# Patient Record
Sex: Female | Born: 2000
Health system: Southern US, Community
[De-identification: ages and names within clinical notes are randomized; demographics above are authoritative.]

---
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
## 2021-01-07 ENCOUNTER — Other Ambulatory Visit (HOSPITAL_COMMUNITY): Payer: Self-pay | Admitting: Psychiatry

## 2021-01-10 ENCOUNTER — Encounter (HOSPITAL_COMMUNITY): Payer: Self-pay | Admitting: Psychiatry

## 2021-01-10 ENCOUNTER — Ambulatory Visit (INDEPENDENT_AMBULATORY_CARE_PROVIDER_SITE_OTHER): Payer: BC Managed Care – PPO | Admitting: Psychiatry

## 2021-01-10 VITALS — BP 112/70 | HR 84 | Temp 98.3°F | Ht 65.25 in | Wt 153.0 lb

## 2021-01-10 DIAGNOSIS — F411 Generalized anxiety disorder: Secondary | ICD-10-CM

## 2021-01-10 DIAGNOSIS — F4323 Adjustment disorder with mixed anxiety and depressed mood: Secondary | ICD-10-CM

## 2021-01-10 DIAGNOSIS — F0630 Mood disorder due to known physiological condition, unspecified: Secondary | ICD-10-CM | POA: Diagnosis not present
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
## 2021-01-22 ENCOUNTER — Other Ambulatory Visit: Payer: Self-pay | Admitting: Pediatrics

## 2021-02-12 IMAGING — DX DG ANKLE COMPLETE 3+V*L*
3 series · 3 of 3 positions shown · non-contrast
Comparison: None

CLINICAL DATA: Acute LEFT ankle pain, injured jumping down while
cheering yesterday, pain and swelling LEFT foot/ankle

EXAM:
LEFT ANKLE COMPLETE - 3+ VIEW

[ankle lat]
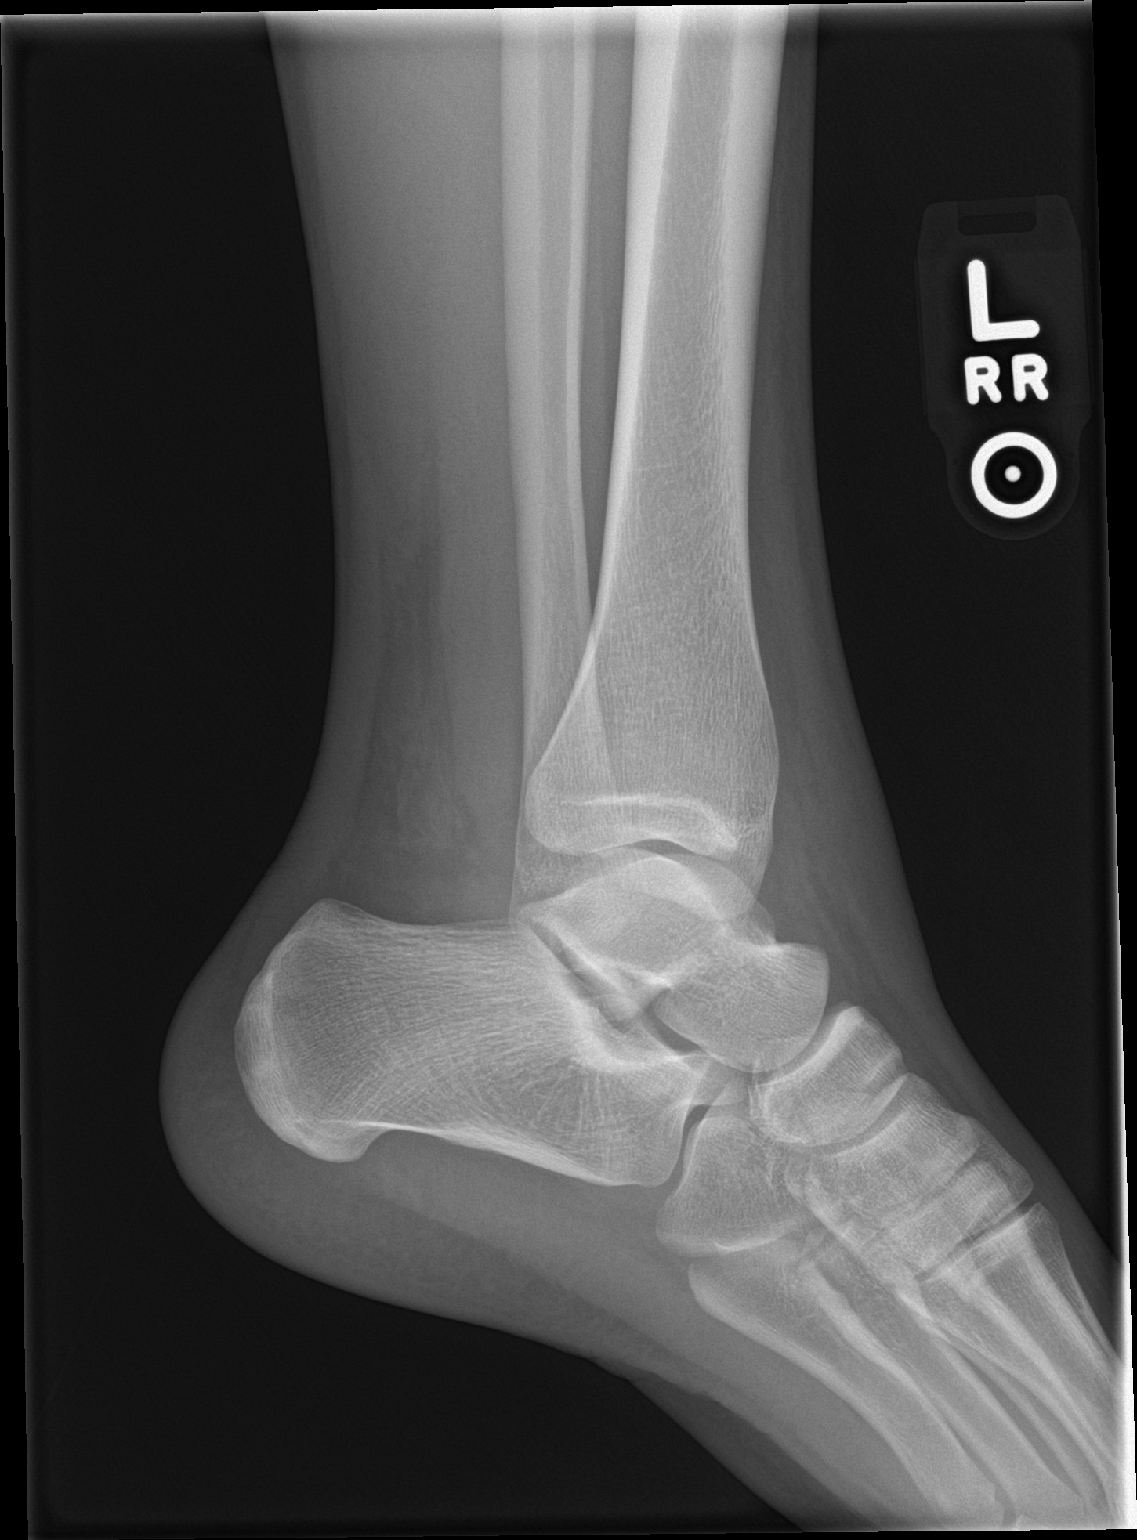

[ankle ap]
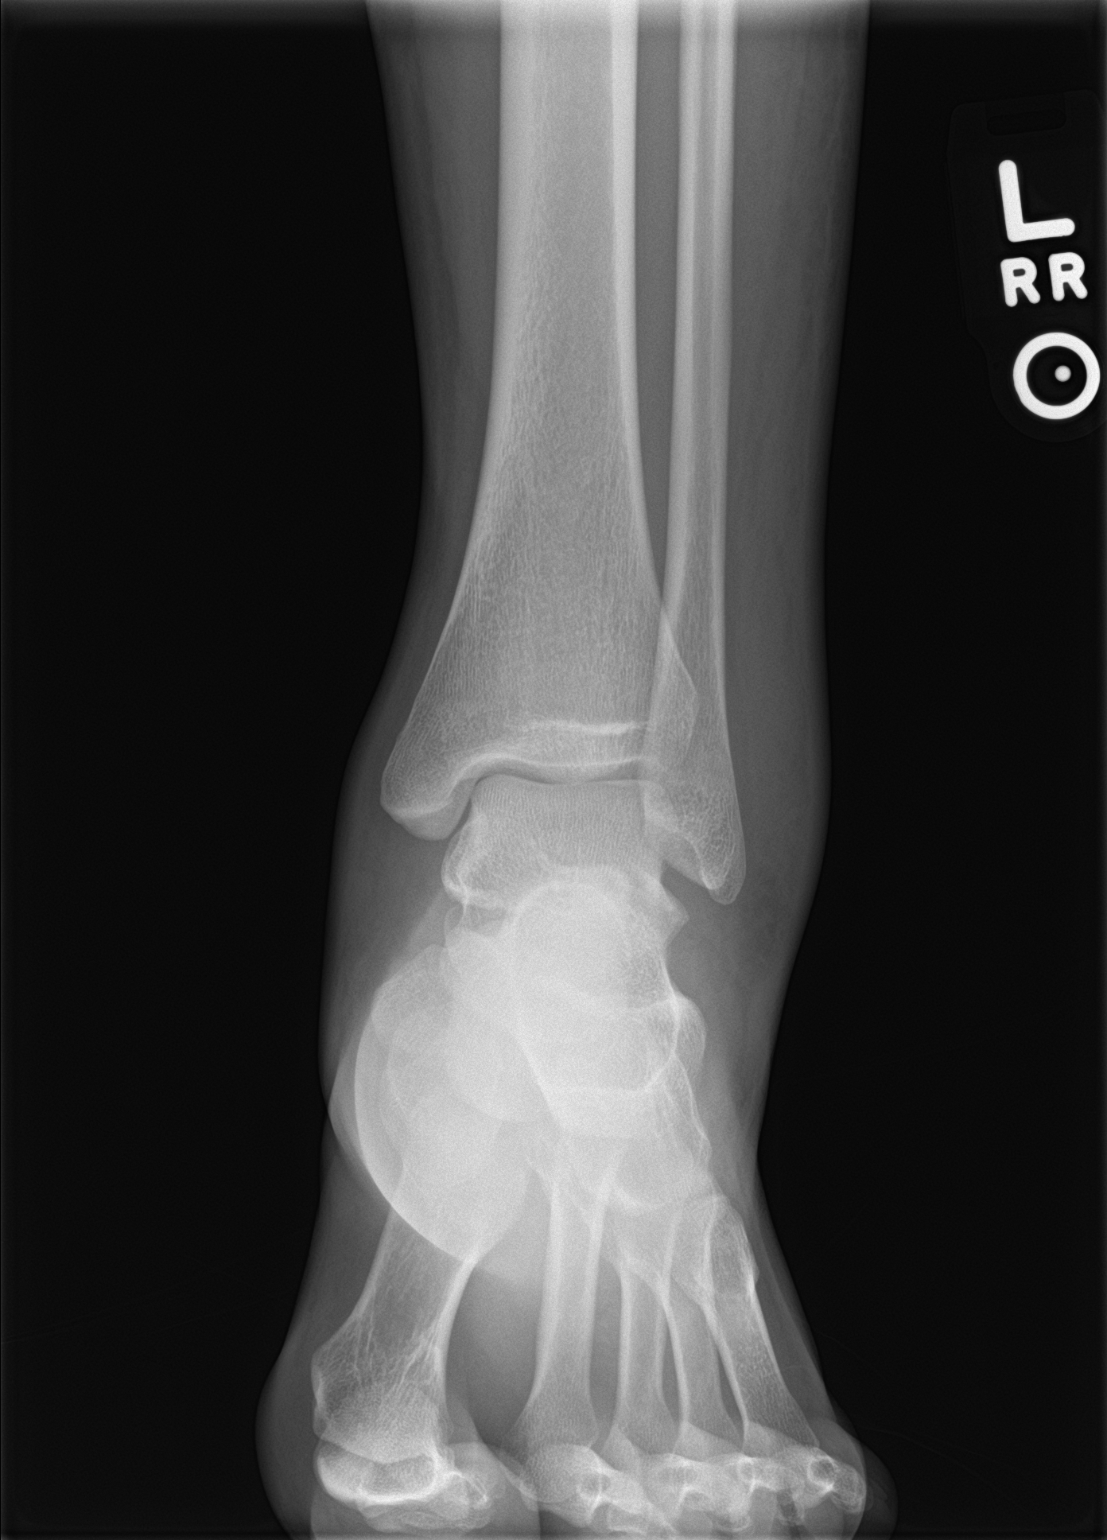

[ankle obl]
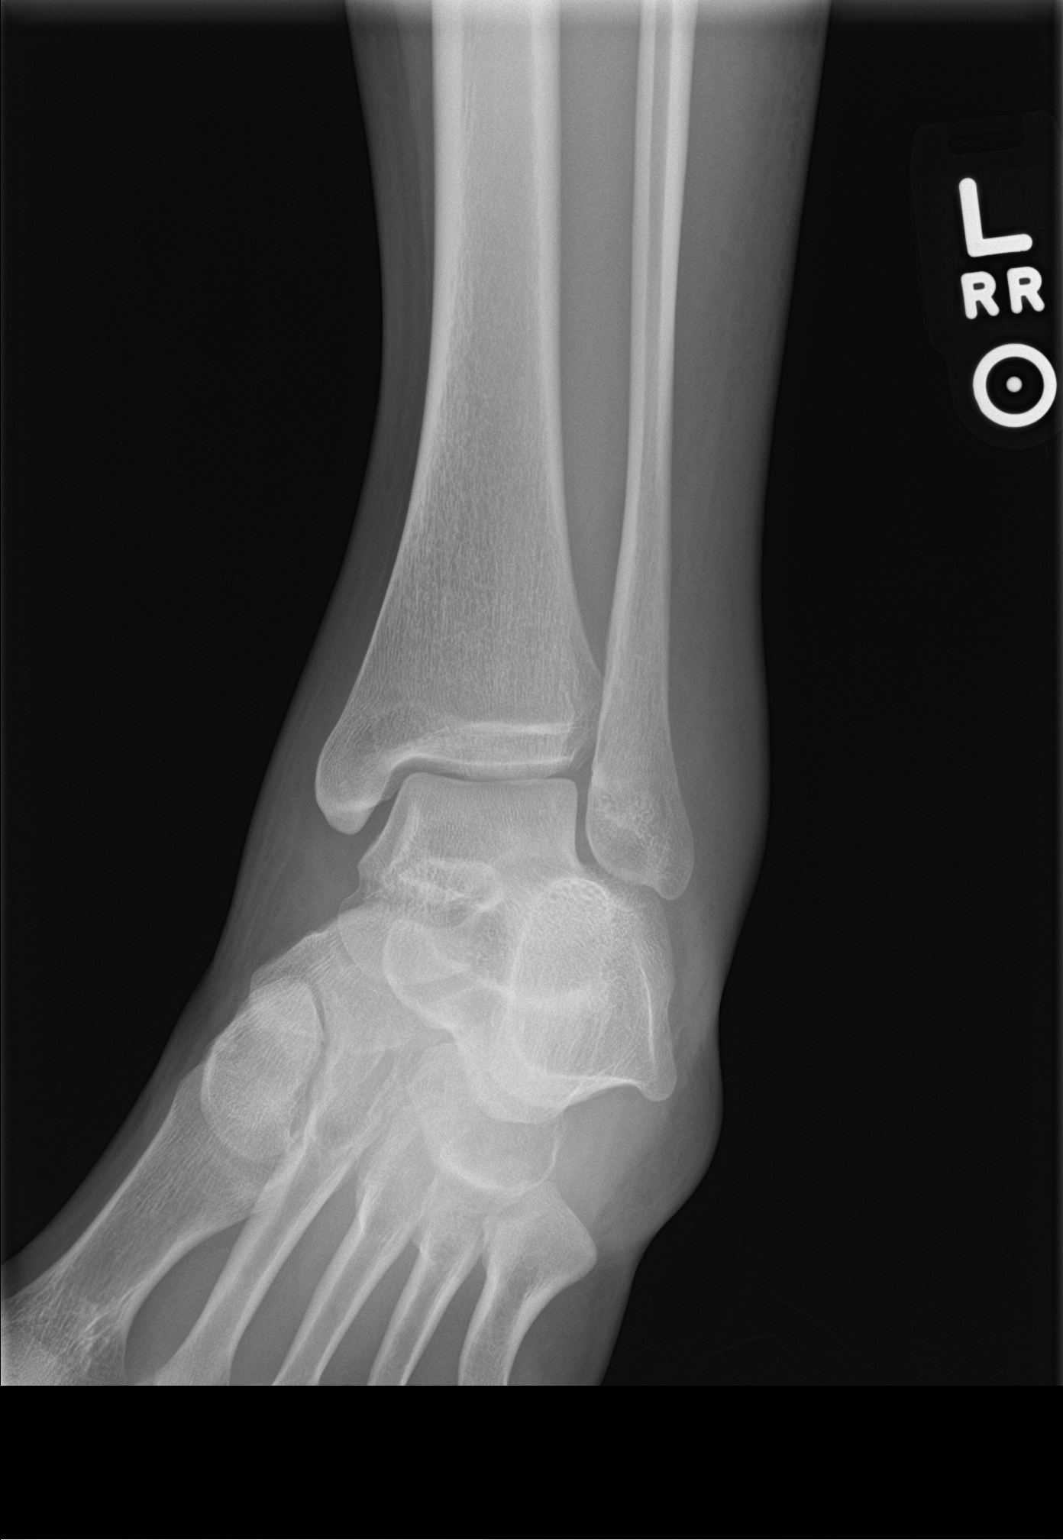

[3 of 3 positions shown; findings below may reference images not displayed]

FINDINGS: Diffuse soft tissue swelling greatest laterally.

Osseous mineralization normal.

Ankle joint space preserved.

No acute fracture, dislocation, or bone destruction.
IMPRESSION: No acute osseous abnormalities.

## 2021-02-12 IMAGING — DX DG FOOT COMPLETE 3+V*L*
3 series · 3 of 3 positions shown · non-contrast
Comparison: None

CLINICAL DATA: Acute LEFT ankle pain, injured jumping down while
cheering yesterday, pain and swelling LEFT foot/ankle

EXAM:
LEFT FOOT - COMPLETE 3+ VIEW

[foot ap]
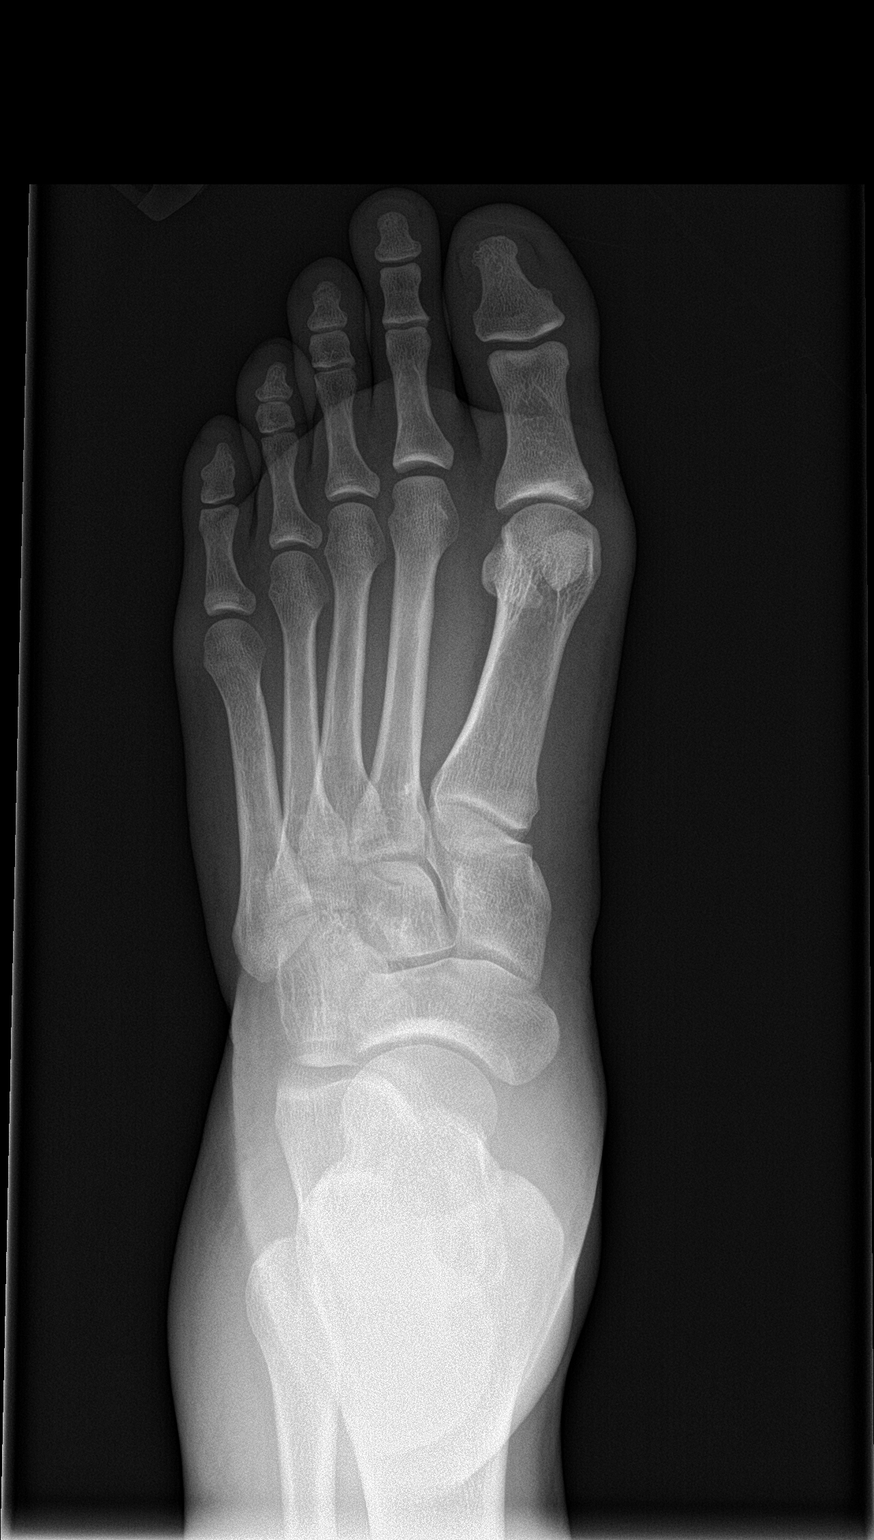

[foot obl]
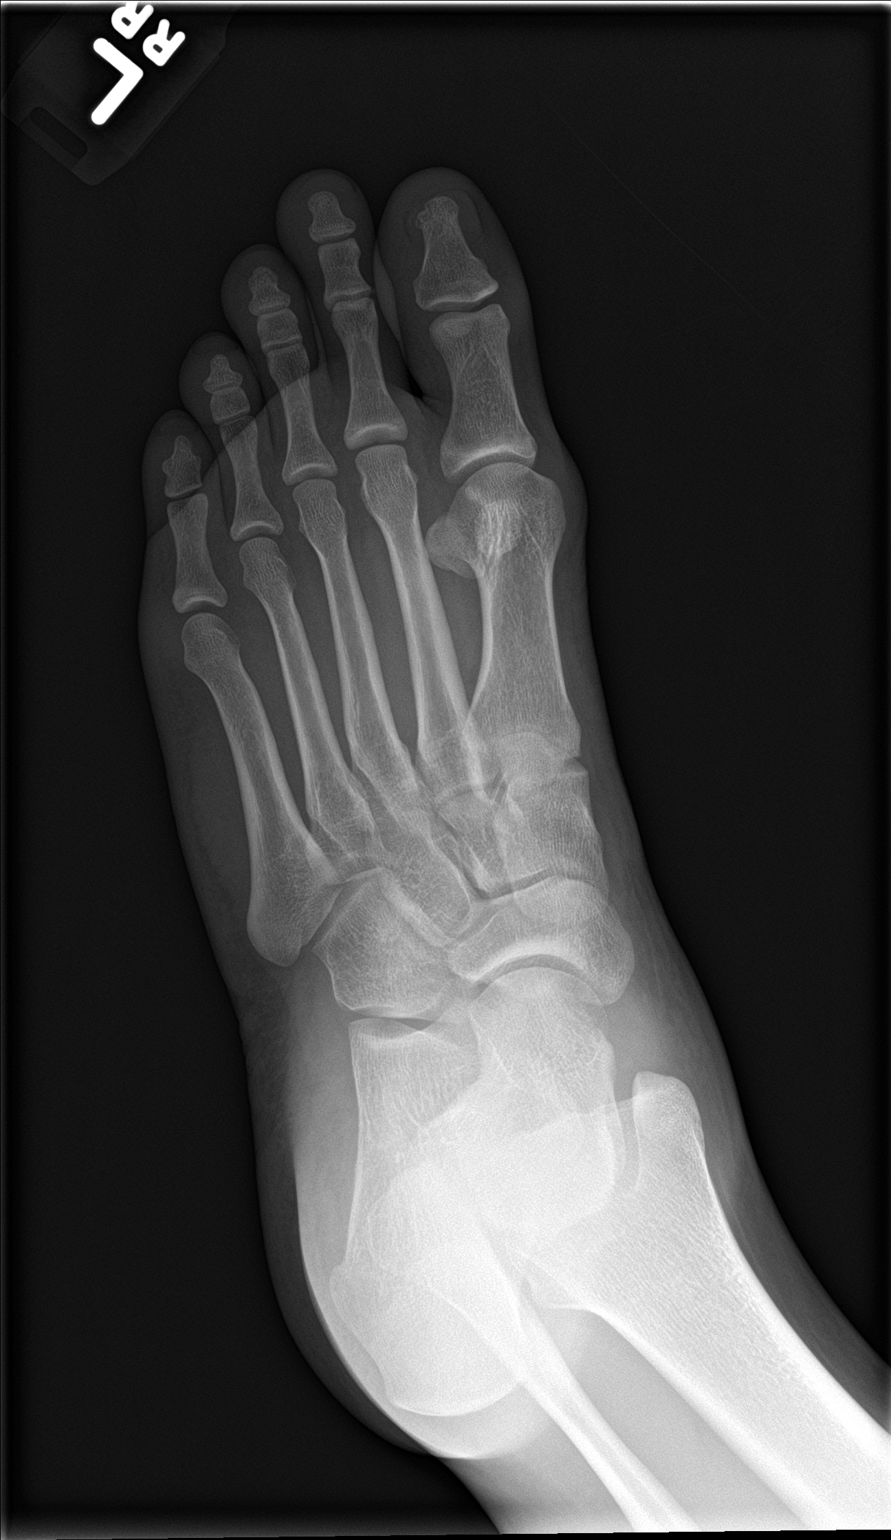

[foot lat]
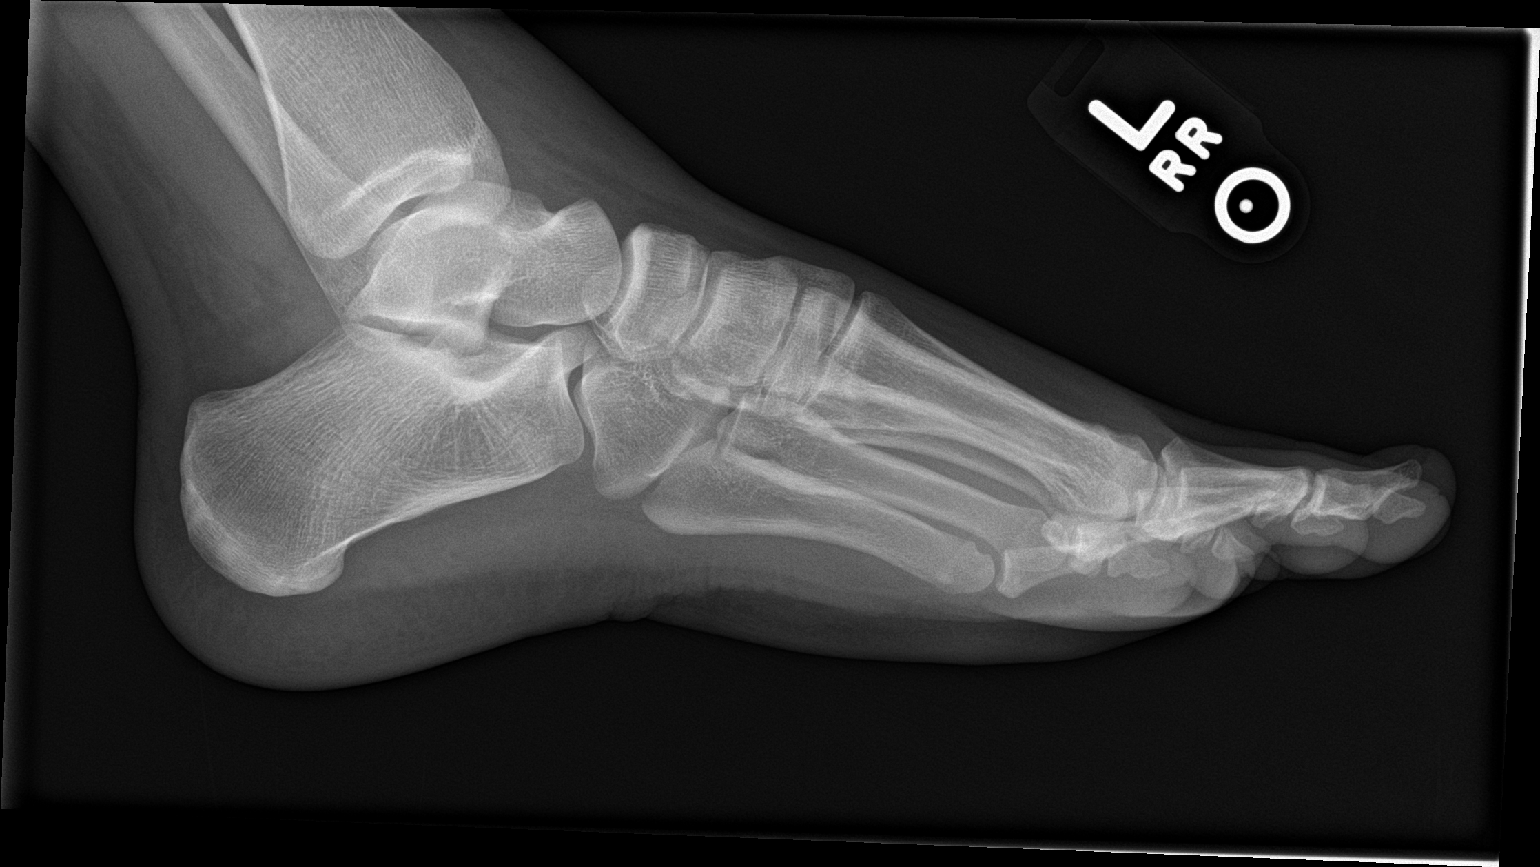

[3 of 3 positions shown; findings below may reference images not displayed]

FINDINGS: Soft tissue swelling anteriorly at ankle extending especially
laterally.

Osseous mineralization normal.

Joint spaces preserved.

No fracture, dislocation, or bone destruction.
IMPRESSION: No acute osseous abnormalities.

## 2021-03-09 ENCOUNTER — Ambulatory Visit (HOSPITAL_COMMUNITY): Payer: BC Managed Care – PPO | Admitting: Licensed Clinical Social Worker

## 2021-03-17 ENCOUNTER — Telehealth (HOSPITAL_COMMUNITY): Payer: BC Managed Care – PPO | Admitting: Psychiatry

## 2021-04-20 ENCOUNTER — Other Ambulatory Visit (HOSPITAL_COMMUNITY): Payer: Self-pay | Admitting: Psychiatry

## 2021-06-03 ENCOUNTER — Other Ambulatory Visit: Payer: Self-pay | Admitting: Pediatrics

## 2021-06-03 ENCOUNTER — Other Ambulatory Visit (HOSPITAL_COMMUNITY): Payer: Self-pay | Admitting: Psychiatry

## 2021-06-03 DIAGNOSIS — F4323 Adjustment disorder with mixed anxiety and depressed mood: Secondary | ICD-10-CM

## 2021-06-06 ENCOUNTER — Telehealth (HOSPITAL_COMMUNITY): Payer: Self-pay

## 2021-06-06 NOTE — Telephone Encounter (Signed)
Patient left vm stating she needs refill on duloxetine. I called her back and left a vm informing her that she missed her last appt and needs to be seen so that we can refill the medication.

## 2021-06-07 ENCOUNTER — Telehealth (INDEPENDENT_AMBULATORY_CARE_PROVIDER_SITE_OTHER): Payer: BC Managed Care – PPO | Admitting: Psychiatry

## 2021-06-07 ENCOUNTER — Encounter (HOSPITAL_COMMUNITY): Payer: Self-pay | Admitting: Psychiatry

## 2021-06-07 DIAGNOSIS — F4323 Adjustment disorder with mixed anxiety and depressed mood: Secondary | ICD-10-CM

## 2021-06-07 DIAGNOSIS — F411 Generalized anxiety disorder: Secondary | ICD-10-CM | POA: Diagnosis not present

## 2021-06-07 DIAGNOSIS — F0630 Mood disorder due to known physiological condition, unspecified: Secondary | ICD-10-CM

## 2021-06-07 MED ORDER — DULOXETINE HCL 30 MG PO CPEP: ORAL_CAPSULE | ORAL | 0 refills | Status: DC

## 2021-06-07 MED ORDER — LAMOTRIGINE 25 MG PO TABS: ORAL_TABLET | ORAL | 0 refills | Status: DC
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
## 2021-06-30 ENCOUNTER — Ambulatory Visit: Payer: Self-pay

## 2021-06-30 ENCOUNTER — Ambulatory Visit: Payer: BC Managed Care – PPO | Admitting: Family Medicine

## 2021-06-30 VITALS — BP 118/64 | Ht 65.0 in | Wt 170.0 lb

## 2021-06-30 DIAGNOSIS — M25572 Pain in left ankle and joints of left foot: Secondary | ICD-10-CM

## 2021-06-30 DIAGNOSIS — M7742 Metatarsalgia, left foot: Secondary | ICD-10-CM | POA: Diagnosis not present

## 2021-06-30 DIAGNOSIS — M357 Hypermobility syndrome: Secondary | ICD-10-CM | POA: Insufficient documentation

## 2021-06-30 MED ORDER — MELOXICAM 7.5 MG PO TABS: 7.5000 mg | ORAL_TABLET | Freq: Two times a day (BID) | ORAL | 1 refills | Status: DC | PRN

## 2021-06-30 NOTE — Patient Instructions (Signed)
Good to see you Please try ice  Please try the exercises  Please use the mobic for 5 days straight and then as needed   Please send me a message in MyChart with any questions or updates.  Please see me back in 4 weeks.   --Dr. Jordan Likes
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
## 2021-06-30 NOTE — Assessment & Plan Note (Signed)
Her hypermobility is likely contributing to her pain with  normal findings on ultrasound.  Has had previous injuries to the left foot. -Counseled on home exercise therapy and supportive care. -Meloxicam. -Cam walker. -Could consider physical therapy or further imaging.

## 2021-06-30 NOTE — Assessment & Plan Note (Signed)
beighton score 9/9.

## 2021-08-12 ENCOUNTER — Other Ambulatory Visit: Payer: Self-pay | Admitting: Family Medicine

## 2021-08-27 ENCOUNTER — Other Ambulatory Visit (HOSPITAL_COMMUNITY): Payer: Self-pay | Admitting: Psychiatry

## 2021-08-27 DIAGNOSIS — F4323 Adjustment disorder with mixed anxiety and depressed mood: Secondary | ICD-10-CM

## 2021-09-06 ENCOUNTER — Encounter (HOSPITAL_COMMUNITY): Payer: Self-pay | Admitting: Psychiatry

## 2021-09-06 ENCOUNTER — Telehealth (INDEPENDENT_AMBULATORY_CARE_PROVIDER_SITE_OTHER): Payer: BC Managed Care – PPO | Admitting: Psychiatry

## 2021-09-06 DIAGNOSIS — F411 Generalized anxiety disorder: Secondary | ICD-10-CM | POA: Diagnosis not present

## 2021-09-06 DIAGNOSIS — F0630 Mood disorder due to known physiological condition, unspecified: Secondary | ICD-10-CM

## 2021-09-06 DIAGNOSIS — F4323 Adjustment disorder with mixed anxiety and depressed mood: Secondary | ICD-10-CM | POA: Diagnosis not present
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
## 2021-11-25 ENCOUNTER — Other Ambulatory Visit (HOSPITAL_COMMUNITY): Payer: Self-pay | Admitting: Psychiatry

## 2021-12-18 ENCOUNTER — Other Ambulatory Visit (HOSPITAL_COMMUNITY): Payer: Self-pay | Admitting: Psychiatry

## 2021-12-18 DIAGNOSIS — F4323 Adjustment disorder with mixed anxiety and depressed mood: Secondary | ICD-10-CM

## 2022-02-22 ENCOUNTER — Other Ambulatory Visit (HOSPITAL_COMMUNITY): Payer: Self-pay | Admitting: Psychiatry

## 2022-03-02 ENCOUNTER — Other Ambulatory Visit (HOSPITAL_COMMUNITY): Payer: Self-pay | Admitting: Psychiatry

## 2022-03-02 DIAGNOSIS — F4323 Adjustment disorder with mixed anxiety and depressed mood: Secondary | ICD-10-CM

## 2022-03-19 ENCOUNTER — Telehealth (INDEPENDENT_AMBULATORY_CARE_PROVIDER_SITE_OTHER): Payer: BC Managed Care – PPO | Admitting: Psychiatry

## 2022-03-19 ENCOUNTER — Encounter (HOSPITAL_COMMUNITY): Payer: Self-pay | Admitting: Psychiatry

## 2022-03-19 DIAGNOSIS — F411 Generalized anxiety disorder: Secondary | ICD-10-CM | POA: Diagnosis not present

## 2022-03-19 DIAGNOSIS — F4323 Adjustment disorder with mixed anxiety and depressed mood: Secondary | ICD-10-CM

## 2022-03-19 DIAGNOSIS — F0630 Mood disorder due to known physiological condition, unspecified: Secondary | ICD-10-CM
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
## 2022-05-24 ENCOUNTER — Other Ambulatory Visit (HOSPITAL_COMMUNITY): Payer: Self-pay | Admitting: Psychiatry

## 2022-05-25 ENCOUNTER — Encounter (HOSPITAL_COMMUNITY): Payer: Self-pay | Admitting: Psychiatry

## 2022-05-25 ENCOUNTER — Telehealth (HOSPITAL_COMMUNITY): Payer: Self-pay | Admitting: Psychiatry
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
## 2022-06-11 ENCOUNTER — Other Ambulatory Visit (HOSPITAL_COMMUNITY): Payer: Self-pay | Admitting: Psychiatry

## 2022-06-11 DIAGNOSIS — F4323 Adjustment disorder with mixed anxiety and depressed mood: Secondary | ICD-10-CM

## 2022-07-23 ENCOUNTER — Telehealth (HOSPITAL_COMMUNITY): Payer: BC Managed Care – PPO | Admitting: Psychiatry

## 2022-08-20 ENCOUNTER — Telehealth (INDEPENDENT_AMBULATORY_CARE_PROVIDER_SITE_OTHER): Payer: BC Managed Care – PPO | Admitting: Psychiatry

## 2022-08-20 ENCOUNTER — Encounter (HOSPITAL_COMMUNITY): Payer: Self-pay | Admitting: Psychiatry

## 2022-08-20 ENCOUNTER — Ambulatory Visit: Payer: Self-pay | Admitting: Podiatry

## 2022-08-20 DIAGNOSIS — F0630 Mood disorder due to known physiological condition, unspecified: Secondary | ICD-10-CM | POA: Diagnosis not present

## 2022-08-20 DIAGNOSIS — F411 Generalized anxiety disorder: Secondary | ICD-10-CM

## 2022-08-20 DIAGNOSIS — F4323 Adjustment disorder with mixed anxiety and depressed mood: Secondary | ICD-10-CM | POA: Diagnosis not present

## 2022-08-20 MED ORDER — DULOXETINE HCL 30 MG PO CPEP: 30.0000 mg | ORAL_CAPSULE | Freq: Every day | ORAL | 0 refills | Status: DC

## 2022-08-20 MED ORDER — LAMOTRIGINE 25 MG PO TABS: 25.0000 mg | ORAL_TABLET | Freq: Every day | ORAL | 0 refills | Status: DC
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
## 2022-08-24 ENCOUNTER — Ambulatory Visit: Payer: Self-pay | Admitting: Podiatry

## 2022-09-10 ENCOUNTER — Telehealth (HOSPITAL_COMMUNITY): Payer: BC Managed Care – PPO | Admitting: Psychiatry

## 2022-09-18 ENCOUNTER — Other Ambulatory Visit (HOSPITAL_COMMUNITY): Payer: Self-pay | Admitting: Psychiatry

## 2022-09-18 DIAGNOSIS — F4323 Adjustment disorder with mixed anxiety and depressed mood: Secondary | ICD-10-CM

## 2022-10-20 ENCOUNTER — Other Ambulatory Visit (HOSPITAL_COMMUNITY): Payer: Self-pay | Admitting: Psychiatry

## 2022-10-22 ENCOUNTER — Encounter: Payer: Self-pay | Admitting: *Deleted

## 2022-11-04 ENCOUNTER — Encounter (HOSPITAL_BASED_OUTPATIENT_CLINIC_OR_DEPARTMENT_OTHER): Payer: Self-pay

## 2022-11-04 ENCOUNTER — Emergency Department (HOSPITAL_BASED_OUTPATIENT_CLINIC_OR_DEPARTMENT_OTHER)
Admission: EM | Admit: 2022-11-04 | Discharge: 2022-11-04 | Disposition: A | Discharge status: Home / Self Care | Payer: BC Managed Care – PPO | Attending: Emergency Medicine | Admitting: Emergency Medicine

## 2022-11-04 DIAGNOSIS — R2242 Localized swelling, mass and lump, left lower limb: Secondary | ICD-10-CM | POA: Diagnosis present

## 2022-11-04 DIAGNOSIS — L03116 Cellulitis of left lower limb: Secondary | ICD-10-CM

## 2022-11-04 DIAGNOSIS — L02416 Cutaneous abscess of left lower limb: Secondary | ICD-10-CM | POA: Diagnosis not present

## 2022-11-04 NOTE — Discharge Instructions (Signed)
Continue doxycycline and topical antibiotic as previously prescribed.  Apply warm compresses as frequently as possible for the next several days.  Return to the ER if not improving in the next 2 to 3 days, or if symptoms worsen or change.

## 2022-11-04 NOTE — ED Triage Notes (Addendum)
Pt reports she has a bump on LLE and it is hurting. Was seen by UC on Friday and was given abx. Provider told her she either has a blood infection or got bit by something. Pt with large raised and warm bump to back of LT knee. Pain to mid upper thigh and mid lower calf. No fevers but feels nauseous. Pt on doxy now. Took tylenol and ibuprofen at 10pm with no relief.

## 2022-11-04 NOTE — ED Provider Notes (Signed)
[place] [facility] Provider Note   CSN: [id number] Arrival date & time: 11/04/22  0020     History  Chief Complaint  Patient presents with   Leg Swelling    Sherry Foley is a 22 y.o. female.  Patient is a 22 year old female presenting with complaints of a painful lump to the back of her left leg.  This has been there for the past 5 days.  She was seen at urgent care 2 days ago and prescribed doxycycline and what sounds like Bactroban, but this does not seem to be helping.  She denies any fevers or chills.  She denies any injury or trauma.  The history is provided by the patient.       Home Medications Prior to Admission medications   Medication Sig Start Date End Date Taking? Authorizing Provider  DULoxetine (CYMBALTA) 30 MG capsule Take 1 capsule (30 mg total) by mouth daily. 08/20/22   Thresa Ross, MD  fluticasone (FLONASE) 50 MCG/ACT nasal spray 1 spray by Each Nare route daily for 30 days. 04/25/20   [provider]  lamoTRIgine (LAMICTAL) 25 MG tablet TAKE 1 TABLET (25 MG TOTAL) BY MOUTH DAILY. 10/22/22   Thresa Ross, MD  levocetirizine Elita Boone) 5 MG tablet Take by mouth. 04/25/20 04/25/21  [provider]  Levonorgestrel-Ethinyl Estradiol (AMETHIA) 0.15-0.03 &0.01 MG tablet TAKE 1 TABLET BY MOUTH EVERY DAY 02/09/19 02/09/20  [provider]  meloxicam (MOBIC) 7.5 MG tablet TAKE 1 TABLET BY MOUTH 2 TIMES DAILY AS NEEDED. 08/14/21   Myra Rude, MD  ondansetron (ZOFRAN-ODT) 4 MG disintegrating tablet Take 4 mg by mouth every 8 (eight) hours as needed. 04/25/20   [provider]  predniSONE (DELTASONE) 20 MG tablet Take by mouth. 04/25/20   [provider]  trimethoprim-polymyxin b (POLYTRIM) ophthalmic solution SMARTSIG:In Eye(s) 06/01/20   [provider]  Vitamin D, Ergocalciferol, (DRISDOL) 1.25 MG (50000 UNIT) CAPS capsule TAKE 1 CAPSULE (50,000 UNITS TOTAL) BY MOUTH EVERY 7  (SEVEN) DAYS. 12/02/19   Georges Mouse, NP      Allergies    Patient has no known allergies.    Review of Systems   Review of Systems  All other systems reviewed and are negative.   Physical Exam Updated Vital Signs BP 133/71 (BP Location: Right Arm)   Pulse 94   Temp 98.2 F (36.8 C) (Oral)   Resp 20   SpO2 99%  Physical Exam Vitals and nursing note reviewed.  Constitutional:      Appearance: Normal appearance.  HENT:     Head: Normocephalic and atraumatic.  Pulmonary:     Effort: Pulmonary effort is normal.  Skin:    General: Skin is warm and dry.     Comments: There is a 1.5 cm x 2.5 cm indurated area to the back of the left leg just below the knee.  There is no palpable fluctuance.  Neurological:     Mental Status: She is alert and oriented to person, place, and time.     ED Results / Procedures / Treatments   Labs (all labs ordered are listed, but only abnormal results are displayed) Labs Reviewed - No data to display  EKG None  Radiology No results found.  Procedures Procedures    Medications Ordered in ED Medications - No data to display  ED Course/ Medical Decision Making/ A&P  Patient presenting with what appears to be an early abscess/cellulitis to the back of the  left leg.  She has been on doxycycline for 2 days but it does not seem to be going away.  Patient given the option of incision and drainage versus aggressive warm soaks.  Patient is declining I&D at this time.  I will have her continue the doxycycline and mupirocin and have her perform warm compresses as frequently as possible for the next few days.  Final Clinical Impression(s) / ED Diagnoses Final diagnoses:  None    Rx / DC Orders ED Discharge Orders     None         Geoffery Lyons, MD 11/04/22 [phone]

## 2022-12-12 ENCOUNTER — Other Ambulatory Visit (HOSPITAL_COMMUNITY): Payer: Self-pay | Admitting: Psychiatry

## 2022-12-12 ENCOUNTER — Telehealth (HOSPITAL_COMMUNITY): Payer: Self-pay | Admitting: *Deleted

## 2022-12-12 DIAGNOSIS — F4323 Adjustment disorder with mixed anxiety and depressed mood: Secondary | ICD-10-CM

## 2022-12-13 NOTE — Telephone Encounter (Signed)
Re-Opened in Error 

## 2022-12-17 ENCOUNTER — Encounter (HOSPITAL_COMMUNITY): Payer: Self-pay | Admitting: Psychiatry

## 2022-12-17 ENCOUNTER — Telehealth (INDEPENDENT_AMBULATORY_CARE_PROVIDER_SITE_OTHER): Payer: BC Managed Care – PPO | Admitting: Psychiatry

## 2022-12-17 DIAGNOSIS — F0630 Mood disorder due to known physiological condition, unspecified: Secondary | ICD-10-CM | POA: Diagnosis not present

## 2022-12-17 DIAGNOSIS — F411 Generalized anxiety disorder: Secondary | ICD-10-CM

## 2022-12-17 DIAGNOSIS — F4323 Adjustment disorder with mixed anxiety and depressed mood: Secondary | ICD-10-CM

## 2022-12-17 MED ORDER — LAMOTRIGINE 25 MG PO TABS: 25.0000 mg | ORAL_TABLET | Freq: Every day | ORAL | 0 refills | Status: DC
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
## 2023-03-26 ENCOUNTER — Telehealth (HOSPITAL_COMMUNITY): Payer: Self-pay | Admitting: *Deleted

## 2023-03-26 MED ORDER — LAMOTRIGINE 25 MG PO TABS: 25.0000 mg | ORAL_TABLET | Freq: Every day | ORAL | 0 refills | Status: DC

## 2023-03-26 NOTE — Addendum Note (Signed)
Addended by: Thresa Ross on: 03/26/2023 09:19 AM   Modules accepted: Orders

## 2023-03-26 NOTE — Telephone Encounter (Signed)
Rx Refill Request --   [id number] - [place],  [place] - [address]   lamoTRIgine (LAMICTAL) 25 MG tablet 90 tablet  Sig - Route: Take 1 tablet (25 mg total) by mouth daily. - Oral   Next Appt --- 06/17/23 Last Appt --  12/17/22

## 2023-04-13 ENCOUNTER — Other Ambulatory Visit (HOSPITAL_COMMUNITY): Payer: Self-pay | Admitting: Psychiatry

## 2023-04-13 DIAGNOSIS — F4323 Adjustment disorder with mixed anxiety and depressed mood: Secondary | ICD-10-CM

## 2023-06-17 ENCOUNTER — Telehealth (HOSPITAL_COMMUNITY): Payer: BC Managed Care – PPO | Admitting: Psychiatry

## 2023-06-17 ENCOUNTER — Encounter (HOSPITAL_COMMUNITY): Payer: Self-pay | Admitting: Psychiatry

## 2023-06-17 DIAGNOSIS — F411 Generalized anxiety disorder: Secondary | ICD-10-CM | POA: Diagnosis not present

## 2023-06-17 DIAGNOSIS — F4323 Adjustment disorder with mixed anxiety and depressed mood: Secondary | ICD-10-CM

## 2023-06-17 DIAGNOSIS — F0630 Mood disorder due to known physiological condition, unspecified: Secondary | ICD-10-CM | POA: Diagnosis not present

## 2023-06-17 MED ORDER — DULOXETINE HCL 30 MG PO CPEP: 30.0000 mg | ORAL_CAPSULE | Freq: Every day | ORAL | 0 refills | Status: DC

## 2023-06-17 MED ORDER — LAMOTRIGINE 25 MG PO TABS: 37.5000 mg | ORAL_TABLET | Freq: Every day | ORAL | 1 refills | Status: DC
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
## 2023-06-27 ENCOUNTER — Other Ambulatory Visit (HOSPITAL_COMMUNITY): Payer: Self-pay | Admitting: Psychiatry

## 2023-08-19 ENCOUNTER — Telehealth (HOSPITAL_COMMUNITY): Payer: Self-pay | Admitting: *Deleted

## 2023-08-19 DIAGNOSIS — F4323 Adjustment disorder with mixed anxiety and depressed mood: Secondary | ICD-10-CM

## 2023-08-19 MED ORDER — DULOXETINE HCL 30 MG PO CPEP: 30.0000 mg | ORAL_CAPSULE | Freq: Every day | ORAL | 0 refills | Status: DC

## 2023-08-19 NOTE — Addendum Note (Signed)
 Addended by: Wray Heady on: 08/19/2023 09:58 AM   Modules accepted: Orders

## 2023-08-19 NOTE — Telephone Encounter (Signed)
 [id number] - [place], [place] - [address]   DULoxetine  (CYMBALTA ) 30 MG capsule   Next Appt  12/16/23 Last Appt  06/17/23

## 2023-08-22 ENCOUNTER — Ambulatory Visit: Payer: Self-pay

## 2023-08-22 ENCOUNTER — Ambulatory Visit
Admission: EM | Admit: 2023-08-22 | Discharge: 2023-08-22 | Disposition: A | Discharge status: Home / Self Care | Payer: BC Managed Care – PPO | Attending: Family Medicine | Admitting: Family Medicine

## 2023-08-22 DIAGNOSIS — L309 Dermatitis, unspecified: Secondary | ICD-10-CM

## 2023-08-22 MED ORDER — CLOTRIMAZOLE-BETAMETHASONE 1-0.05 % EX CREA: TOPICAL_CREAM | CUTANEOUS | 0 refills | Status: AC

## 2023-08-22 NOTE — ED Provider Notes (Signed)
[facility]    CSN: [id number] Arrival date & time: 08/22/23  0910      History   Chief Complaint Chief Complaint  Patient presents with   Rash    HPI Sherry Foley is a 23 y.o. female.    Rash Here for a rash in her left axilla that is been there for about a month.  She has tried some cortisone cream and tried some emollient creams.  Nothing is really helping.  No drainage from the area and no fever or chills.  It has been itchy at times and sometimes it burns.  NKDA   Last menstrual cycle was about 2 weeks ago.    History reviewed. No pertinent past medical history.  Patient Active Problem List   Diagnosis Date Noted   Metatarsalgia of left foot 06/30/2021   Hypermobility syndrome 06/30/2021   Dizziness 05/09/2020   Sprain of anterior talofibular ligament of left ankle 10/22/2019   Strain of right hip adductor muscle 10/12/2019   Persistent adjustment disorder with mixed anxiety and depressed mood 03/25/2019   Generalized anxiety disorder 10/24/2017   Injury of right elbow 12/16/2015   Right ankle pain 10/03/2015    History reviewed. No pertinent surgical history.  OB History   No obstetric history on file.      Home Medications    Prior to Admission medications   Medication Sig Start Date End Date Taking? Authorizing Provider  clotrimazole-betamethasone (LOTRISONE) cream Apply to affected area 2 times daily till better, up to 2 weeks. 08/22/23  Yes Zenia Resides, MD  DULoxetine (CYMBALTA) 30 MG capsule Take 1 capsule (30 mg total) by mouth daily. 08/19/23   Thresa Ross, MD  fluticasone (FLONASE) 50 MCG/ACT nasal spray 1 spray by Each Nare route daily for 30 days. 04/25/20   [provider]  lamoTRIgine (LAMICTAL) 25 MG tablet TAKE 1 TABLET (25 MG TOTAL) BY MOUTH DAILY. 06/27/23   Thresa Ross, MD  levocetirizine Elita Boone) 5 MG tablet Take by mouth. 04/25/20 04/25/21  [provider]  Levonorgestrel-Ethinyl  Estradiol (AMETHIA) 0.15-0.03 &0.01 MG tablet TAKE 1 TABLET BY MOUTH EVERY DAY 02/09/19 02/09/20  [provider]  meloxicam (MOBIC) 7.5 MG tablet TAKE 1 TABLET BY MOUTH 2 TIMES DAILY AS NEEDED. 08/14/21   Myra Rude, MD  ondansetron (ZOFRAN-ODT) 4 MG disintegrating tablet Take 4 mg by mouth every 8 (eight) hours as needed. 04/25/20   [provider]  predniSONE (DELTASONE) 20 MG tablet Take by mouth. 04/25/20   [provider]  trimethoprim-polymyxin b (POLYTRIM) ophthalmic solution SMARTSIG:In Eye(s) 06/01/20   [provider]  Vitamin D, Ergocalciferol, (DRISDOL) 1.25 MG (50000 UNIT) CAPS capsule TAKE 1 CAPSULE (50,000 UNITS TOTAL) BY MOUTH EVERY 7 (SEVEN) DAYS. 12/02/19   Georges Mouse, NP    Family History History reviewed. No pertinent family history.  Social History Social History   Tobacco Use   Smoking status: Never   Smokeless tobacco: Never  Vaping Use   Vaping status: Never Used  Substance Use Topics   Alcohol use: No    Alcohol/week: 0.0 standard drinks of alcohol   Drug use: Never     Allergies   Patient has no known allergies.   Review of Systems Review of Systems  Skin:  Positive for rash.     Physical Exam Triage Vital Signs ED Triage Vitals  Encounter Vitals Group     BP 08/22/23 0915 118/70     Systolic BP Percentile --  Diastolic BP Percentile --      Pulse Rate 08/22/23 0915 75     Resp 08/22/23 0915 17     Temp 08/22/23 0915 98.5 F (36.9 C)     Temp Source 08/22/23 0915 Oral     SpO2 08/22/23 0915 99 %     Weight --      Height --      Head Circumference --      Peak Flow --      Pain Score 08/22/23 0916 1     Pain Loc --      Pain Education --      Exclude from Growth Chart --    No data found.  Updated Vital Signs BP 118/70 (BP Location: Right Arm)   Pulse 75   Temp 98.5 F (36.9 C) (Oral)   Resp 17   LMP  (LMP Unknown)   SpO2 99%   Visual Acuity Right Eye Distance:   Left Eye  Distance:   Bilateral Distance:    Right Eye Near:   Left Eye Near:    Bilateral Near:     Physical Exam Vitals reviewed.  Constitutional:      General: She is not in acute distress.    Appearance: She is not ill-appearing, toxic-appearing or diaphoretic.  Skin:    Coloration: Skin is not jaundiced or pale.     Comments: There is an area of hyperpigmentation and mild erythema that is fairly confluent in her left axilla laterally.  It is about 3 cm in diameter.  There is no skin induration and no drainage from the area.  It is fairly flat  Neurological:     Mental Status: She is alert and oriented to person, place, and time.  Psychiatric:        Behavior: Behavior normal.      UC Treatments / Results  Labs (all labs ordered are listed, but only abnormal results are displayed) Labs Reviewed - No data to display  EKG   Radiology No results found.  Procedures Procedures (including critical care time)  Medications Ordered in UC Medications - No data to display  Initial Impression / Assessment and Plan / UC Course  I have reviewed the triage vital signs and the nursing notes.  Pertinent labs & imaging results that were available during my care of the patient were reviewed by me and considered in my medical decision making (see chart for details).     Lotrisone is sent in to treat dermatitis that could be candidal in nature.  She is given contact information for dermatology.  Also she is given instructions on how to set up primary care on our website. Final Clinical Impressions(s) / UC Diagnoses   Final diagnoses:  Dermatitis     Discharge Instructions      Clotrimazole-betamethasone cream--apply 2 times daily to the affected area, up to 2 weeks.  You can use the QR code/website at the back of the summary paperwork to schedule yourself a new patient appointment with primary care       ED Prescriptions     Medication Sig Dispense Auth. Provider    clotrimazole-betamethasone (LOTRISONE) cream Apply to affected area 2 times daily till better, up to 2 weeks. 15 g Zenia Resides, MD      PDMP not reviewed this encounter.   Zenia Resides, MD 08/22/23 [phone]

## 2023-08-22 NOTE — ED Triage Notes (Signed)
Pt c/o rash under LT underarm x 1 month. Itchy and burns at time. Using cortizone and dry skin cream prn. Also tried benedryl with no relief. Hx of eczema.

## 2023-08-22 NOTE — Discharge Instructions (Signed)
Clotrimazole-betamethasone cream--apply 2 times daily to the affected area, up to 2 weeks.  You can use the QR code/website at the back of the summary paperwork to schedule yourself a new patient appointment with primary care

## 2023-08-26 ENCOUNTER — Telehealth (HOSPITAL_COMMUNITY): Payer: Self-pay | Admitting: *Deleted

## 2023-08-26 MED ORDER — LAMOTRIGINE 25 MG PO TABS: 25.0000 mg | ORAL_TABLET | Freq: Every day | ORAL | 0 refills | Status: DC

## 2023-08-26 NOTE — Telephone Encounter (Signed)
 Rx Refill Request [id number] - [place], [place] - [address]   lamoTRIgine (LAMICTAL) 25 MG tablet   Next Appt  12/16/23 Last Appt  06/17/23

## 2023-08-26 NOTE — Addendum Note (Signed)
 Addended by: Thresa Ross on: 08/26/2023 04:40 PM   Modules accepted: Orders

## 2023-09-03 ENCOUNTER — Ambulatory Visit
Admission: RE | Admit: 2023-09-03 | Discharge: 2023-09-03 | Disposition: A | Discharge status: Home / Self Care | Payer: BC Managed Care – PPO | Source: Ambulatory Visit | Attending: Emergency Medicine | Admitting: Emergency Medicine

## 2023-09-03 VITALS — BP 132/76 | HR 95 | Temp 99.8°F | Resp 18 | Ht 65.0 in | Wt 145.0 lb

## 2023-09-03 DIAGNOSIS — R051 Acute cough: Secondary | ICD-10-CM

## 2023-09-03 MED ORDER — BENZONATATE 100 MG PO CAPS: 100.0000 mg | ORAL_CAPSULE | Freq: Three times a day (TID) | ORAL | 0 refills | Status: AC

## 2023-09-03 NOTE — ED Provider Notes (Signed)
 [facility]    CSN: [id number] Arrival date & time: 09/03/23  1323      History   Chief Complaint Chief Complaint  Patient presents with   Cough    Chest pains - Entered by patient    HPI Sherry Foley is a 23 y.o. female.   Patient presents to clinic over concerns of a nonproductive cough that has been present for the past 3 days.  She is also having central chest pain that worsens with coughing and is reproducible to palpation.  She has not had any nasal congestion, rhinorrhea, sore throat, fevers, headaches or bodyaches.  Denies wheezing or shortness of breath.  Does not have a history of asthma or respiratory issues.  Has been taking Tylenol for the chest wall pain.   The history is provided by the patient and medical records.  Cough   History reviewed. No pertinent past medical history.  Patient Active Problem List   Diagnosis Date Noted   Metatarsalgia of left foot 06/30/2021   Hypermobility syndrome 06/30/2021   Dizziness 05/09/2020   Sprain of anterior talofibular ligament of left ankle 10/22/2019   Strain of right hip adductor muscle 10/12/2019   Persistent adjustment disorder with mixed anxiety and depressed mood 03/25/2019   Generalized anxiety disorder 10/24/2017   Injury of right elbow 12/16/2015   Right ankle pain 10/03/2015    History reviewed. No pertinent surgical history.  OB History   No obstetric history on file.      Home Medications    Prior to Admission medications   Medication Sig Start Date End Date Taking? Authorizing Provider  benzonatate (TESSALON) 100 MG capsule Take 1 capsule (100 mg total) by mouth every 8 (eight) hours. 09/03/23  Yes Rinaldo Ratel, [place], FNP  clotrimazole-betamethasone (LOTRISONE) cream Apply to affected area 2 times daily till better, up to 2 weeks. 08/22/23  Yes Zenia Resides, MD  DULoxetine (CYMBALTA) 30 MG capsule Take 1 capsule (30 mg total) by mouth daily. 08/19/23  Yes Thresa Ross, MD   fluticasone (FLONASE) 50 MCG/ACT nasal spray 1 spray by Each Nare route daily for 30 days. 04/25/20  Yes [provider]  lamoTRIgine (LAMICTAL) 25 MG tablet Take 1 tablet (25 mg total) by mouth daily. 08/26/23  Yes Thresa Ross, MD  meloxicam (MOBIC) 7.5 MG tablet TAKE 1 TABLET BY MOUTH 2 TIMES DAILY AS NEEDED. 08/14/21  Yes Myra Rude, MD  levocetirizine Elita Boone) 5 MG tablet Take by mouth. 04/25/20 04/25/21  [provider]  Levonorgestrel-Ethinyl Estradiol (AMETHIA) 0.15-0.03 &0.01 MG tablet TAKE 1 TABLET BY MOUTH EVERY DAY 02/09/19 02/09/20  [provider]    Family History Family History  Problem Relation Age of Onset   Healthy Mother     Social History Social History   Tobacco Use   Smoking status: Never   Smokeless tobacco: Never  Vaping Use   Vaping status: Never Used  Substance Use Topics   Alcohol use: No    Alcohol/week: 0.0 standard drinks of alcohol   Drug use: Never     Allergies   Patient has no known allergies.   Review of Systems Review of Systems  Per HPI   Physical Exam Triage Vital Signs ED Triage Vitals  Encounter Vitals Group     BP 09/03/23 1343 132/76     Systolic BP Percentile --      Diastolic BP Percentile --      Pulse Rate 09/03/23 1343 95  Resp 09/03/23 1343 18     Temp 09/03/23 1343 99.8 F (37.7 C)     Temp Source 09/03/23 1343 Oral     SpO2 09/03/23 1343 100 %     Weight 09/03/23 1344 145 lb (65.8 kg)     Height 09/03/23 1344 5\' 5"  (1.651 m)     Head Circumference --      Peak Flow --      Pain Score 09/03/23 1344 5     Pain Loc --      Pain Education --      Exclude from Growth Chart --    No data found.  Updated Vital Signs BP 132/76 (BP Location: Right Arm)   Pulse 95   Temp 99.8 F (37.7 C) (Oral)   Resp 18   Ht 5\' 5"  (1.651 m)   Wt 145 lb (65.8 kg)   LMP  (LMP Unknown)   SpO2 100%   BMI 24.13 kg/m   Visual Acuity Right Eye Distance:   Left Eye Distance:   Bilateral  Distance:    Right Eye Near:   Left Eye Near:    Bilateral Near:     Physical Exam Vitals and nursing note reviewed.  Constitutional:      Appearance: Normal appearance.  HENT:     Head: Normocephalic and atraumatic.     Right Ear: External ear normal.     Left Ear: External ear normal.     Nose: Nose normal.     Mouth/Throat:     Mouth: Mucous membranes are moist.  Eyes:     Conjunctiva/sclera: Conjunctivae normal.  Cardiovascular:     Rate and Rhythm: Normal rate and regular rhythm.     Heart sounds: Normal heart sounds. No murmur heard. Pulmonary:     Effort: Pulmonary effort is normal. No respiratory distress.     Breath sounds: Normal breath sounds.  Chest:     Chest wall: Tenderness present. No swelling or crepitus.       Comments: Central sternal chest wall tenderness that is reproducible to palpation.  No swelling, crepitus or bruising. Musculoskeletal:        General: Normal range of motion.  Skin:    General: Skin is warm.  Neurological:     General: No focal deficit present.     Mental Status: She is alert and oriented to person, place, and time.  Psychiatric:        Mood and Affect: Mood normal.        Behavior: Behavior normal. Behavior is cooperative.      UC Treatments / Results  Labs (all labs ordered are listed, but only abnormal results are displayed) Labs Reviewed - No data to display  EKG   Radiology No results found.  Procedures Procedures (including critical care time)  Medications Ordered in UC Medications - No data to display  Initial Impression / Assessment and Plan / UC Course  I have reviewed the triage vital signs and the nursing notes.  Pertinent labs & imaging results that were available during my care of the patient were reviewed by me and considered in my medical decision making (see chart for details).  Vitals and triage reviewed, patient is hemodynamically stable.  Lungs are vesicular, heart with regular rate and  rhythm.  Chest wall pain is reproducible to palpation, with reassuring for muscular etiology.  Overall physical exam is reassuring, will trial Tessalon for cough, safe with Lamictal.  Plan of care, follow-up care and  return precautions given, patient verbalized understanding.  Work note provided.     Final Clinical Impressions(s) / UC Diagnoses   Final diagnoses:  Acute cough     Discharge Instructions      Overall your physical exam was reassuring.  You can continue to take 500 mg of Tylenol every 8 hours for chest wall pain.  The cough medicine should help to suppress your cough, therefore helping your chest wall pain.  You can also consider sleeping with a humidifier at night.  Return to clinic or follow-up with your primary care provider if you develop any fever, wheezing, shortness of breath, or any new concerning symptoms.    ED Prescriptions     Medication Sig Dispense Auth. Provider   benzonatate (TESSALON) 100 MG capsule Take 1 capsule (100 mg total) by mouth every 8 (eight) hours. 21 capsule Tino Ronan, [place], [place]      PDMP not reviewed this encounter.   Jozlyn Schatz, [place] N, [place] 09/03/23 [phone]

## 2023-09-03 NOTE — Discharge Instructions (Signed)
 Overall your physical exam was reassuring.  You can continue to take 500 mg of Tylenol every 8 hours for chest wall pain.  The cough medicine should help to suppress your cough, therefore helping your chest wall pain.  You can also consider sleeping with a humidifier at night.  Return to clinic or follow-up with your primary care provider if you develop any fever, wheezing, shortness of breath, or any new concerning symptoms.

## 2023-09-03 NOTE — ED Triage Notes (Signed)
 Patient c/o a tickle in her throat that has turned into a deep cough and chest pain x 3 days.  Denies coughing anything up.  Denies any nasal drainage and afebrile.  Patient has taken Tylenol.  Patient did do a teledoc visit today and dx w/RSV and given a medication but has not started yet due to pain in chest has gotten worse.

## 2023-09-11 ENCOUNTER — Telehealth (HOSPITAL_COMMUNITY): Payer: Self-pay | Admitting: *Deleted

## 2023-09-11 NOTE — Telephone Encounter (Signed)
 Rx 90 Day Refill Request [id number] - [place], [place] - [address]   DULoxetine (CYMBALTA) 30 MG capsule  Last  Fill Date 08/19/23  Next Appt  12/16/23 Last Appt  06/17/23

## 2023-09-16 ENCOUNTER — Telehealth (HOSPITAL_COMMUNITY): Payer: Self-pay | Admitting: *Deleted

## 2023-09-16 DIAGNOSIS — F4323 Adjustment disorder with mixed anxiety and depressed mood: Secondary | ICD-10-CM

## 2023-09-16 MED ORDER — DULOXETINE HCL 30 MG PO CPEP: 30.0000 mg | ORAL_CAPSULE | Freq: Every day | ORAL | 0 refills | Status: DC

## 2023-09-16 NOTE — Telephone Encounter (Signed)
 Rx Refill Request DULoxetine (CYMBALTA) 30 MG capsule  Last Fill Date -08/19/23  Next Appt  12/16/23 Last Appt  06/17/23

## 2023-09-16 NOTE — Addendum Note (Signed)
 Addended by: Thresa Ross on: 09/16/2023 10:13 AM   Modules accepted: Orders

## 2023-09-21 ENCOUNTER — Other Ambulatory Visit (HOSPITAL_COMMUNITY): Payer: Self-pay | Admitting: Psychiatry

## 2023-11-25 ENCOUNTER — Telehealth (HOSPITAL_COMMUNITY): Payer: Self-pay | Admitting: *Deleted

## 2023-11-25 ENCOUNTER — Other Ambulatory Visit (HOSPITAL_COMMUNITY): Payer: Self-pay | Admitting: *Deleted

## 2023-11-25 MED ORDER — LAMOTRIGINE 25 MG PO TABS: 25.0000 mg | ORAL_TABLET | Freq: Every day | ORAL | 1 refills | Status: DC

## 2023-11-25 NOTE — Telephone Encounter (Signed)
 Rx Request [id number] - [place] - [address]   lamoTRIgine  (LAMICTAL ) 25 MG tablet   Next Appt  12/16/23 Last Appt  06/17/23

## 2023-11-25 NOTE — Telephone Encounter (Signed)
 Provider Authorize Refill lamoTRIgine  (LAMICTAL ) 25 MG tablet  Sent  Co Sign

## 2023-12-16 ENCOUNTER — Encounter (HOSPITAL_COMMUNITY): Payer: Self-pay

## 2023-12-16 ENCOUNTER — Telehealth (HOSPITAL_COMMUNITY): Payer: BC Managed Care – PPO | Admitting: Psychiatry

## 2024-01-27 ENCOUNTER — Telehealth (HOSPITAL_COMMUNITY): Payer: Self-pay

## 2024-01-27 DIAGNOSIS — F4323 Adjustment disorder with mixed anxiety and depressed mood: Secondary | ICD-10-CM

## 2024-01-27 NOTE — Telephone Encounter (Signed)
 Medication refill - [facility] from [facility] in [place] for a new Duloxetine  order, last prescribed 09/16/23 and filled on 10/06/23 for a 90 day supply. Patient no showed for appointment 12/16/23 and has not rescheduled to date.

## 2024-01-28 MED ORDER — DULOXETINE HCL 30 MG PO CPEP: 30.0000 mg | ORAL_CAPSULE | Freq: Every day | ORAL | 0 refills | Status: DC

## 2024-01-28 NOTE — Telephone Encounter (Signed)
 A 30 day supply of patient's prescribed Duloxetine  30 mg capsules e-scribed as requested by Dr. Geralene for patient to [facility] [place] with instructions, patient to be seen for any further refills.  Patient has rescheduled next appointment for 02/03/24. Patient to call back if any issues prior to then.

## 2024-02-03 ENCOUNTER — Telehealth (INDEPENDENT_AMBULATORY_CARE_PROVIDER_SITE_OTHER): Admitting: Psychiatry

## 2024-02-03 ENCOUNTER — Encounter (HOSPITAL_COMMUNITY): Payer: Self-pay | Admitting: Psychiatry

## 2024-02-03 DIAGNOSIS — F1090 Alcohol use, unspecified, uncomplicated: Secondary | ICD-10-CM

## 2024-02-03 DIAGNOSIS — F4323 Adjustment disorder with mixed anxiety and depressed mood: Secondary | ICD-10-CM

## 2024-02-03 DIAGNOSIS — F411 Generalized anxiety disorder: Secondary | ICD-10-CM | POA: Diagnosis not present

## 2024-02-03 DIAGNOSIS — F0630 Mood disorder due to known physiological condition, unspecified: Secondary | ICD-10-CM

## 2024-02-03 MED ORDER — DULOXETINE HCL 30 MG PO CPEP: 30.0000 mg | ORAL_CAPSULE | Freq: Every day | ORAL | 2 refills | Status: DC

## 2024-02-03 MED ORDER — LAMOTRIGINE 25 MG PO TABS: 25.0000 mg | ORAL_TABLET | Freq: Every day | ORAL | 1 refills | Status: AC
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
## 2024-03-16 ENCOUNTER — Telehealth (HOSPITAL_COMMUNITY): Payer: Self-pay | Admitting: Psychiatry

## 2024-03-16 DIAGNOSIS — F4323 Adjustment disorder with mixed anxiety and depressed mood: Secondary | ICD-10-CM

## 2024-03-16 MED ORDER — DULOXETINE HCL 30 MG PO CPEP: 30.0000 mg | ORAL_CAPSULE | Freq: Every day | ORAL | 1 refills | Status: AC

## 2024-03-16 NOTE — Telephone Encounter (Signed)
 Received fax from patient's pharmacy requesting 90-day prescription (90 capsules) for  DULoxetine  (CYMBALTA ) 30 MG capsule .    [id number] - [place], [place] - [address] (Ph: [phone])   Last ordered: 02/03/2024 - 30 capsules with 2 refills Last visit: 02/03/2024 Next visit: 05/04/2024

## 2024-03-26 ENCOUNTER — Ambulatory Visit: Payer: BC Managed Care – PPO | Admitting: Dermatology

## 2024-05-04 ENCOUNTER — Telehealth (HOSPITAL_COMMUNITY): Admitting: Psychiatry

## 2024-05-04 ENCOUNTER — Encounter (HOSPITAL_COMMUNITY): Payer: Self-pay
